# Patient Record
Sex: Male | Born: 1990 | Race: Black or African American | Marital: Single | Smoking: Never smoker
Health system: Southern US, Community
[De-identification: ages and names within clinical notes are randomized; demographics above are authoritative.]

## PROBLEM LIST (undated history)

## (undated) DIAGNOSIS — M419 Scoliosis, unspecified: Secondary | ICD-10-CM

## (undated) HISTORY — PX: BACK SURGERY: SHX140

---

## 2012-10-29 ENCOUNTER — Emergency Department (HOSPITAL_COMMUNITY)
Admission: EM | Admit: 2012-10-29 | Discharge: 2012-10-30 | Disposition: A | Discharge status: Home / Self Care | Payer: Medicaid Other | Attending: Emergency Medicine | Admitting: Emergency Medicine

## 2012-10-29 ENCOUNTER — Encounter (HOSPITAL_COMMUNITY): Payer: Self-pay | Admitting: Emergency Medicine

## 2012-10-29 DIAGNOSIS — N342 Other urethritis: Secondary | ICD-10-CM | POA: Insufficient documentation

## 2012-10-29 LAB — URINE MICROSCOPIC-ADD ON

## 2012-10-29 LAB — URINALYSIS, ROUTINE W REFLEX MICROSCOPIC
Hgb urine dipstick: NEGATIVE
Ketones, ur: 15 mg/dL — AB
Protein, ur: NEGATIVE mg/dL
Urobilinogen, UA: 1 mg/dL (ref 0.0–1.0)

## 2012-10-29 MED ORDER — CEFTRIAXONE SODIUM 250 MG IJ SOLR
250.0000 mg | Freq: Once | INTRAMUSCULAR | Status: AC
  Administered 2012-10-30: 250 mg via INTRAMUSCULAR
  Filled 2012-10-29: qty 250

## 2012-10-29 MED ORDER — AZITHROMYCIN 250 MG PO TABS
1000.0000 mg | ORAL_TABLET | Freq: Once | ORAL | Status: AC
  Administered 2012-10-30: 1000 mg via ORAL
  Filled 2012-10-29: qty 4

## 2012-10-29 NOTE — ED Notes (Signed)
PT. REPORTS PENILE DISCHARGE ONSET 2 DAYS AGO .

## 2012-10-30 MED ORDER — LIDOCAINE HCL (PF) 1 % IJ SOLN
INTRAMUSCULAR | Status: AC
  Administered 2012-10-30: 5 mL
  Filled 2012-10-30: qty 5

## 2012-10-30 NOTE — ED Provider Notes (Signed)
History     CSN: [id number]  Arrival date & time 10/29/12  2108   First MD Initiated Contact with Patient 10/29/12 2343      Chief Complaint  Patient presents with  . Penile Discharge    (Consider location/radiation/quality/duration/timing/severity/associated sxs/prior treatment) HPI Comments: Patient with penile discharge for the past 3 days unprotected intercourse 2 weeks ago denies dysuria, testicular pain   Patient is a 22 y.o. male presenting with penile discharge. The history is provided by the patient.  Penile Discharge This is a new problem. The current episode started in the past 7 days. The problem occurs intermittently. The problem has been unchanged. Pertinent negatives include no abdominal pain, anorexia, fatigue, rash, swollen glands, urinary symptoms or vomiting. Nothing aggravates the symptoms. He has tried nothing for the symptoms.    History reviewed. No pertinent past medical history.  History reviewed. No pertinent past surgical history.  No family history on file.  History  Substance Use Topics  . Smoking status: Never Smoker   . Smokeless tobacco: Not on file  . Alcohol Use: No      Review of Systems  Constitutional: Negative for fatigue.  Gastrointestinal: Negative for vomiting, abdominal pain and anorexia.  Genitourinary: Positive for discharge. Negative for dysuria, penile swelling, scrotal swelling and testicular pain.  Skin: Negative for rash.    Allergies  Review of patient's allergies indicates no known allergies.  Home Medications  No current outpatient prescriptions on file.  BP 138/73  Pulse 70  Temp(Src) 99.1 F (37.3 C) (Oral)  Resp 16  SpO2 100%  Physical Exam  Nursing note and vitals reviewed. Constitutional: He appears well-developed and well-nourished.  Eyes: Pupils are equal, round, and reactive to light.  Neck: Normal range of motion.  Cardiovascular: Normal rate.   Pulmonary/Chest: Effort normal and breath sounds  normal.  Abdominal: Soft.  Genitourinary: Penis normal. No penile tenderness.  Musculoskeletal: Normal range of motion.  Neurological: He is alert.  Skin: Skin is warm.    ED Course  Procedures (including critical care time)  Labs Reviewed  URINALYSIS, ROUTINE W REFLEX MICROSCOPIC - Abnormal; Notable for the following:    APPearance CLOUDY (*)    Specific Gravity, Urine 1.033 (*)    Ketones, ur 15 (*)    Leukocytes, UA MODERATE (*)    All other components within normal limits  GC/CHLAMYDIA PROBE AMP  URINE MICROSCOPIC-ADD ON   No results found.   1. Urethritis       MDM   Cultures obtained treated        Arman Filter, NP 10/30/12 0025

## 2012-10-30 NOTE — ED Provider Notes (Signed)
Medical screening examination/treatment/procedure(s) were performed by non-physician practitioner and as supervising physician I was immediately available for consultation/collaboration.   Arvo Ealy H Tamsyn Owusu, MD 10/30/12 0602 

## 2012-10-31 LAB — GC/CHLAMYDIA PROBE AMP: CT Probe RNA: POSITIVE — AB

## 2012-11-01 ENCOUNTER — Telehealth (HOSPITAL_COMMUNITY): Payer: Self-pay | Admitting: Emergency Medicine

## 2012-11-01 NOTE — ED Notes (Signed)
+  Chlamydia. Patient treated with Rocephin and Zithromax. [facility] faxed. 

## 2012-12-06 ENCOUNTER — Emergency Department (HOSPITAL_COMMUNITY)
Admission: EM | Admit: 2012-12-06 | Discharge: 2012-12-07 | Disposition: A | Discharge status: Home / Self Care | Payer: No Typology Code available for payment source | Attending: Emergency Medicine | Admitting: Emergency Medicine

## 2012-12-06 ENCOUNTER — Encounter (HOSPITAL_COMMUNITY): Payer: Self-pay | Admitting: *Deleted

## 2012-12-06 DIAGNOSIS — Z9889 Other specified postprocedural states: Secondary | ICD-10-CM | POA: Insufficient documentation

## 2012-12-06 DIAGNOSIS — Y92410 Unspecified street and highway as the place of occurrence of the external cause: Secondary | ICD-10-CM | POA: Insufficient documentation

## 2012-12-06 DIAGNOSIS — S39012A Strain of muscle, fascia and tendon of lower back, initial encounter: Secondary | ICD-10-CM

## 2012-12-06 DIAGNOSIS — S335XXA Sprain of ligaments of lumbar spine, initial encounter: Secondary | ICD-10-CM | POA: Insufficient documentation

## 2012-12-06 DIAGNOSIS — Y9389 Activity, other specified: Secondary | ICD-10-CM | POA: Insufficient documentation

## 2012-12-06 DIAGNOSIS — Z8739 Personal history of other diseases of the musculoskeletal system and connective tissue: Secondary | ICD-10-CM | POA: Insufficient documentation

## 2012-12-06 MED ORDER — IBUPROFEN 800 MG PO TABS
800.0000 mg | ORAL_TABLET | Freq: Once | ORAL | Status: AC
  Administered 2012-12-07: 800 mg via ORAL
  Filled 2012-12-06: qty 1
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
## 2012-12-07 MED ORDER — NAPROXEN 500 MG PO TABS: 500.0000 mg | ORAL_TABLET | Freq: Two times a day (BID) | ORAL | Status: DC

## 2012-12-07 MED ORDER — CYCLOBENZAPRINE HCL 10 MG PO TABS: 10.0000 mg | ORAL_TABLET | Freq: Two times a day (BID) | ORAL | Status: DC | PRN

## 2012-12-08 NOTE — ED Provider Notes (Signed)
History/physical exam/procedure(s) were performed by non-physician practitioner and as supervising physician I was immediately available for consultation/collaboration. I have reviewed all notes and am in agreement with care and plan.   Hilario Quarry, MD 12/08/12 [phone]

## 2014-03-26 ENCOUNTER — Emergency Department (HOSPITAL_COMMUNITY)
Admission: EM | Admit: 2014-03-26 | Discharge: 2014-03-26 | Disposition: A | Discharge status: Home / Self Care | Payer: 59 | Attending: Emergency Medicine | Admitting: Emergency Medicine

## 2014-03-26 ENCOUNTER — Encounter (HOSPITAL_COMMUNITY): Payer: Self-pay | Admitting: Emergency Medicine

## 2014-03-26 ENCOUNTER — Emergency Department (HOSPITAL_COMMUNITY): Payer: 59

## 2014-03-26 DIAGNOSIS — S46909A Unspecified injury of unspecified muscle, fascia and tendon at shoulder and upper arm level, unspecified arm, initial encounter: Secondary | ICD-10-CM | POA: Insufficient documentation

## 2014-03-26 DIAGNOSIS — S0003XA Contusion of scalp, initial encounter: Secondary | ICD-10-CM | POA: Insufficient documentation

## 2014-03-26 DIAGNOSIS — Y92410 Unspecified street and highway as the place of occurrence of the external cause: Secondary | ICD-10-CM | POA: Insufficient documentation

## 2014-03-26 DIAGNOSIS — M4120 Other idiopathic scoliosis, site unspecified: Secondary | ICD-10-CM | POA: Diagnosis not present

## 2014-03-26 DIAGNOSIS — Z791 Long term (current) use of non-steroidal anti-inflammatories (NSAID): Secondary | ICD-10-CM | POA: Diagnosis not present

## 2014-03-26 DIAGNOSIS — IMO0002 Reserved for concepts with insufficient information to code with codable children: Secondary | ICD-10-CM | POA: Diagnosis present

## 2014-03-26 DIAGNOSIS — Y9389 Activity, other specified: Secondary | ICD-10-CM | POA: Diagnosis not present

## 2014-03-26 DIAGNOSIS — S4980XA Other specified injuries of shoulder and upper arm, unspecified arm, initial encounter: Secondary | ICD-10-CM | POA: Diagnosis not present

## 2014-03-26 DIAGNOSIS — S1093XA Contusion of unspecified part of neck, initial encounter: Secondary | ICD-10-CM

## 2014-03-26 DIAGNOSIS — Z9889 Other specified postprocedural states: Secondary | ICD-10-CM | POA: Diagnosis not present

## 2014-03-26 DIAGNOSIS — S0083XA Contusion of other part of head, initial encounter: Secondary | ICD-10-CM | POA: Insufficient documentation

## 2014-03-26 HISTORY — DX: Scoliosis, unspecified: M41.9

## 2014-03-26 MED ORDER — NAPROXEN 500 MG PO TABS: 500.0000 mg | ORAL_TABLET | Freq: Two times a day (BID) | ORAL | Status: DC

## 2014-03-26 MED ORDER — TRAMADOL HCL 50 MG PO TABS: 50.0000 mg | ORAL_TABLET | Freq: Four times a day (QID) | ORAL | Status: DC | PRN

## 2014-03-26 MED ORDER — METHOCARBAMOL 500 MG PO TABS: 500.0000 mg | ORAL_TABLET | Freq: Two times a day (BID) | ORAL | Status: DC
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
## 2014-03-26 NOTE — ED Notes (Signed)
PA at bedside.
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
## 2014-03-26 NOTE — ED Notes (Signed)
PT ambulated with baseline gait; VSS; A&Ox3; no signs of distress; respirations even and unlabored; skin warm and dry; no questions upon discharge.  
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
## 2014-04-01 NOTE — ED Provider Notes (Signed)
Medical screening examination/treatment/procedure(s) were performed by non-physician practitioner and as supervising physician I was immediately available for consultation/collaboration.   EKG Interpretation None        Hilarie Sinha, MD 04/01/14 1256 

## 2014-10-10 ENCOUNTER — Ambulatory Visit (INDEPENDENT_AMBULATORY_CARE_PROVIDER_SITE_OTHER): Payer: 59 | Admitting: Family Medicine

## 2014-10-10 ENCOUNTER — Ambulatory Visit (INDEPENDENT_AMBULATORY_CARE_PROVIDER_SITE_OTHER): Payer: 59

## 2014-10-10 VITALS — BP 116/86 | HR 74 | Temp 98.2°F | Resp 16 | Ht 69.0 in | Wt 150.8 lb

## 2014-10-10 DIAGNOSIS — W19XXXA Unspecified fall, initial encounter: Secondary | ICD-10-CM

## 2014-10-10 DIAGNOSIS — S52121A Displaced fracture of head of right radius, initial encounter for closed fracture: Secondary | ICD-10-CM

## 2014-10-10 DIAGNOSIS — M25521 Pain in right elbow: Secondary | ICD-10-CM

## 2014-10-10 MED ORDER — HYDROCODONE-ACETAMINOPHEN 10-325 MG PO TABS: 0.5000 | ORAL_TABLET | Freq: Four times a day (QID) | ORAL | Status: AC | PRN

## 2014-10-10 MED ORDER — MELOXICAM 7.5 MG PO TABS: 7.5000 mg | ORAL_TABLET | Freq: Every day | ORAL | Status: AC

## 2014-10-10 NOTE — Patient Instructions (Signed)
Apply ice directly to the splint 20 minutes on and 20 minutes off for three days. Go to your orthopedic appointment.  We will call you with the details of this appointment.
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
## 2014-10-11 ENCOUNTER — Telehealth: Payer: Self-pay

## 2014-10-11 NOTE — Telephone Encounter (Signed)
He has been taking the meloxicam with little relief.  After taking the hydrocodone he begins to itch, i advised him to stop taking this as i may be a allergic reaction.  Is there anything else he can do or that we can prescribe to give his some relief.  Please advise

## 2014-10-11 NOTE — Telephone Encounter (Signed)
Pt states he was given meds yesterday that he believes is making him itch??   Best phone for pt is [phone]   Pharmacy [facility] w [address]

## 2014-10-14 NOTE — Telephone Encounter (Signed)
Spoke with patient.  Advised that he should take benadryl with the opioid to help with itching.  Advised that is his tongue throat or lips swell with the opioids then go to the ED, but advised that opioid allergies are very rare.  He is taking the meloxicam without difficulty.  He has seen the orthopod and they advised that he move the arm as tolerated.  They will see him back in two weeks.  Deliah BostonMichael Clark, MS, PA-C   9:48 AM, 10/14/2014

## 2014-10-26 NOTE — Progress Notes (Signed)
Reviewed documentation and xray and agree w/ assessment and plan. Eva Shaw, MD MPH   
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
## 2016-07-23 ENCOUNTER — Emergency Department (HOSPITAL_COMMUNITY)
Admission: EM | Admit: 2016-07-23 | Discharge: 2016-07-23 | Disposition: A | Discharge status: Home / Self Care | Payer: BLUE CROSS/BLUE SHIELD | Attending: Emergency Medicine | Admitting: Emergency Medicine

## 2016-07-23 ENCOUNTER — Encounter (HOSPITAL_COMMUNITY): Payer: Self-pay | Admitting: *Deleted

## 2016-07-23 DIAGNOSIS — S61411A Laceration without foreign body of right hand, initial encounter: Secondary | ICD-10-CM | POA: Insufficient documentation

## 2016-07-23 DIAGNOSIS — W25XXXA Contact with sharp glass, initial encounter: Secondary | ICD-10-CM | POA: Insufficient documentation

## 2016-07-23 DIAGNOSIS — Y999 Unspecified external cause status: Secondary | ICD-10-CM | POA: Insufficient documentation

## 2016-07-23 DIAGNOSIS — Z23 Encounter for immunization: Secondary | ICD-10-CM | POA: Insufficient documentation

## 2016-07-23 DIAGNOSIS — Y929 Unspecified place or not applicable: Secondary | ICD-10-CM | POA: Insufficient documentation

## 2016-07-23 DIAGNOSIS — Y9389 Activity, other specified: Secondary | ICD-10-CM | POA: Insufficient documentation

## 2016-07-23 MED ORDER — TETANUS-DIPHTH-ACELL PERTUSSIS 5-2.5-18.5 LF-MCG/0.5 IM SUSP
0.5000 mL | Freq: Once | INTRAMUSCULAR | Status: AC
  Administered 2016-07-23: 0.5 mL via INTRAMUSCULAR
  Filled 2016-07-23: qty 0.5

## 2016-07-23 NOTE — ED Notes (Addendum)
Wound cleansed with antiseptic spray.  Antibiotic ointment applied with slightly bulky dressing to keep in place until tomorrow.  Pt instructed to keep it clean and dry.  No ill affects from tDap.
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
## 2016-07-23 NOTE — ED Triage Notes (Signed)
Pt reports cutting rt hand on a bottle last night. Bleeding controlled. Pt also reports left ear pain for several months.

## 2016-07-23 NOTE — Discharge Instructions (Signed)
Keep wound clean with mild soap and water. Keep area covered with a topical antibiotic ointment and bandage, keep bandage dry. Ice and elevate for additional pain relief and swelling. Alternate between Ibuprofen and Tylenol for additional pain relief. Follow up with [facility] or the [facility] in approximately 5-7 days for wound recheck and suture removal, and to establish care at the wellness center. Monitor area for signs of infection to include, but not limited to: increasing pain, spreading redness, drainage/pus, worsening swelling, or fevers. Return to emergency department for emergent changing or worsening symptoms.

## 2016-07-23 NOTE — ED Provider Notes (Signed)
[facility]-EMERGENCY DEPT Provider Note   CSN: [id number] Arrival date & time: 07/23/16  1001    By signing my name below, I, Valentino Saxon, attest that this documentation has been prepared under the direction and in the presence of Misty Foutz Camprubi-Soms, PA-C. Electronically Signed: Valentino Saxon, ED Scribe. 07/23/16. 10:31 AM.   History   Chief Complaint Chief Complaint  Patient presents with  . Laceration   The history is provided by the patient. No language interpreter was used.  Laceration   The incident occurred 6 to 12 hours ago. The laceration is located on the right hand. The laceration is 1 cm in size. The laceration mechanism was a broken glass. The pain is at a severity of 7/10. The pain is moderate. The pain has been intermittent since onset. He reports no foreign bodies present. His tetanus status is unknown.   HPI Comments: Austin Hays is a 26 y.o. male who presents to the Emergency Department complaining of a wound sustained to the right palm that occurred at ~11pm yesterday (11.5hrs PTA) when he was opening a beer bottle and the top of the bottle had a small area of broken glass (piece of glass found on table, no missing glass particles). Bleeding is controlled. He reports associated 7/10, sudden onset, intermittent throbbing nonradiating pain to his right palmar aspect where the cut is, worse with hand movement, and with no tx tried PTA aside from using listerine and peroxide to clean the wound. Pt is unsure of his last Tetanus shot. He denies hx of any bleeding disorders or blood thinners use. Denies numbness, tingling, focal weakness, bruising, swelling, or any other complaints at this time. No PCP at this time.   Of note, he had an ear infection last year and since then he has a lot of popping in his ear, denies ear pain; hasn't followed up with anyone about it. No acute complaints related to his ear at this time.   Past Medical History:  Diagnosis Date  .  Scoliosis     There are no active problems to display for this patient.   Past Surgical History:  Procedure Laterality Date  . BACK SURGERY         Home Medications    Prior to Admission medications   Medication Sig Start Date End Date Taking? Authorizing Provider  HYDROcodone-acetaminophen (NORCO) 10-325 MG per tablet Take 0.5 tablets by mouth every 6 (six) hours as needed for severe pain. 10/10/14   Ofilia Neas, PA-C  meloxicam (MOBIC) 7.5 MG tablet Take 1 tablet (7.5 mg total) by mouth daily. 10/10/14   Ofilia Neas, PA-C    Family History No family history on file.  Social History Social History  Substance Use Topics  . Smoking status: Never Smoker  . Smokeless tobacco: Never Used  . Alcohol use Yes     Allergies   Patient has no known allergies.   Review of Systems Review of Systems  Musculoskeletal: Positive for myalgias (right hand pain). Negative for arthralgias and joint swelling.  Skin: Positive for wound.  Allergic/Immunologic: Negative for immunocompromised state.  Neurological: Negative for weakness and numbness.  Hematological: Does not bruise/bleed easily.  Psychiatric/Behavioral: Negative for confusion.  10 Systems reviewed and all are negative for acute change except as noted in the HPI.    Physical Exam Updated Vital Signs BP 106/78 (BP Location: Left Arm)   Pulse 84   Temp 98.5 F (36.9 C) (Oral)   Resp 17   Ht 5'  7" (1.702 m)   Wt 140 lb (63.5 kg)   SpO2 97%   BMI 21.93 kg/m   Physical Exam  Constitutional: He is oriented to person, place, and time. Vital signs are normal. He appears well-developed and well-nourished.  Non-toxic appearance. No distress.  Afebrile, nontoxic, NAD  HENT:  Head: Normocephalic and atraumatic.  Mouth/Throat: Mucous membranes are normal.  Eyes: Conjunctivae and EOM are normal. Right eye exhibits no discharge. Left eye exhibits no discharge.  Neck: Normal range of motion. Neck supple.    Cardiovascular: Normal rate and intact distal pulses.   Pulmonary/Chest: Effort normal. No respiratory distress.  Abdominal: Normal appearance. He exhibits no distension.  Musculoskeletal: Normal range of motion.  Right hand with FROM intact in all digits, sensation and strength grossly intact, distal pulses intact, no swelling or bruising, no focal bony TTP, with small lacerations as mentioned and pictured below.   Neurological: He is alert and oriented to person, place, and time. He has normal strength. No sensory deficit.  Skin: Skin is warm and dry. Laceration noted. No rash noted.  Palm of right hand with small V-shaped laceration which has already started healing, no ongoing bleeding, flap re-adhered with good wound edge approximation, no palpable foreign bodies felt although unable to visualize under flap due to wound edges healing. SEE PICTURE BELOW.   Psychiatric: He has a normal mood and affect.  Nursing note and vitals reviewed.      ED Treatments / Results   DIAGNOSTIC STUDIES: Oxygen Saturation is 97% on RA, normal by my interpretation.    COORDINATION OF CARE: 10:29 AM Discussed treatment plan with pt at bedside which includes TDAP update and pt agreed to plan.   Labs (all labs ordered are listed, but only abnormal results are displayed) Labs Reviewed - No data to display  EKG  EKG Interpretation None       Radiology No results found.  Procedures Procedures (including critical care time)  Medications Ordered in ED Medications  Tdap (BOOSTRIX) injection 0.5 mL (0.5 mLs Intramuscular Given 07/23/16 1029)     Initial Impression / Assessment and Plan / ED Course  I have reviewed the triage vital signs and the nursing notes.  Pertinent labs & imaging results that were available during my care of the patient were reviewed by me and considered in my medical decision making (see chart for details).  Clinical Course     26 y.o. male here with small lac to R  palm sustained 11hrs ago, flap of skin already readhered and wound edges scabbed over. No palpable FBs. Fairly superficial. Will update Tdap, doubt need for xray imaging. Discussed wound care and tylenol/motrin/ice for pain relief. F/up with [facility] in 5-7 days for wound recheck and to establish care. I explained the diagnosis and have given explicit precautions to return to the ER including for any other new or worsening symptoms. The patient understands and accepts the medical plan as it's been dictated and I have answered their questions. Discharge instructions concerning home care and prescriptions have been given. The patient is STABLE and is discharged to home in good condition.   I personally performed the services described in this documentation, which was scribed in my presence. The recorded information has been reviewed and is accurate.   Final Clinical Impressions(s) / ED Diagnoses   Final diagnoses:  Laceration of right hand without foreign body, initial encounter    New Prescriptions New Prescriptions   No medications on file  Rishith Siddoway Camprubi-Soms, PA-C 07/23/16 1037    Donnetta HutchingBrian Cook, MD 07/25/16 [phone]
